# Patient Record
Sex: Male | Born: 1945 | Race: White | Hispanic: No | Marital: Single | Smoking: Never smoker
Health system: Southern US, Community
[De-identification: ages and names within clinical notes are randomized; demographics above are authoritative.]

---

## 2017-01-02 ENCOUNTER — Encounter: Payer: Self-pay | Admitting: Neurology

## 2017-01-02 ENCOUNTER — Ambulatory Visit (INDEPENDENT_AMBULATORY_CARE_PROVIDER_SITE_OTHER): Payer: Medicare Other | Admitting: Neurology

## 2017-01-02 VITALS — BP 121/76 | HR 86 | Ht 70.0 in | Wt 238.8 lb

## 2017-01-02 DIAGNOSIS — R479 Unspecified speech disturbances: Secondary | ICD-10-CM | POA: Insufficient documentation

## 2017-01-02 MED ORDER — ASPIRIN 325 MG PO TBEC: 325.0000 mg | DELAYED_RELEASE_TABLET | Freq: Every day | ORAL | 1 refills | Status: DC

## 2017-01-02 NOTE — Progress Notes (Signed)
[facility] [address] [place]. [place] [address] [phone]       OFFICE CONSULT NOTE  [name] Date of Birth:  09/14/1945 Medical Record Number:  [id number]   Referring MD:  Dr Lendon ColonelHawks  Reason for Referral:   Speech difficulty HPI: [name] who states that for the last 6 months is noticed some intermittent speech difficulties. He states that it began suddenly. He has to concentrate and speak and feels his speaking is not as fluent as it used to be. 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 He has to speak slowly and deliberately. He denies any slurred speech, word substitutions. His speech difficulty seems more pronounced when he is tired. He feels his diction is not the same. Patient denies any headache, loss of vision, facial weakness, gait or balance problems. Denies tingling numbness or pain. 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 He does complain of some intermittent right hand cramps as well as weakness particularly when he gets up in the morning. He states that he feels his been little clumsy of late but is had no falls or injuries. Is no prior history of strokes TIAs seizures significant head injury with loss of consciousness. He underwent MRI scan of the brain done at [facility] on 10/17/16 which was apparently normal. I do not have the actual films to look at. Denies significant memory loss or cognitive impairment. He lives alone and is independent in activities of daily living. He takes aspirin every day. He denies any , weight loss.  Dysphagia, choking. Fasciculations, muscle wasting ROS:   14 system review of systems is positive for  ringing in the ears, skin moles, joint pain, right hand weakness, slurred speech and all other systems negative  PMH: History reviewed. No pertinent  past medical history.  Social History:  Social History   Socioeconomic History  . Marital status: Single    Spouse name: Not on file  . Number of children: Not on file  . Years of education: Not on file  . Highest education level: Not on file  Social Needs  . Financial resource strain: Not on file  . Food insecurity - worry: Not on file  . Food insecurity - inability: Not on file  . Transportation needs - medical: Not on file  . Transportation needs - non-medical: Not on file  Occupational History  . Not on file  Tobacco Use  . Smoking status: Not on file  Substance and Sexual Activity  . Alcohol use: Not on file  . Drug use: Not on file  . Sexual activity: Not on file  Other Topics Concern  . Not on file  Social History Narrative  . Not on file    Medications:   Current Outpatient Medications on File Prior to Visit  Medication Sig Dispense Refill  . DUREZOL 0.05 % EMUL Apply 1 drop to eye daily.    . finasteride (PROSCAR) 5 MG tablet Take 1 tablet by mouth daily.  0  . Omega-3 Fatty Acids (FISH OIL) 1000 MG CAPS Take 1 capsule by mouth daily.    Marland Kitchen. omeprazole (PRILOSEC) 40 MG capsule Take 1 capsule by mouth daily.  11  . simvastatin (ZOCOR) 40 MG tablet Take 1 tablet by mouth daily.  1   No current facility-administered medications on  file prior to visit.     Allergies:  Not on File  Physical Exam General: well developed, well nourished, seated, in no evident distress Head: head normocephalic and atraumatic.   Neck: supple with no carotid or supraclavicular bruits Cardiovascular: regular rate and rhythm, no murmurs Musculoskeletal: no deformity Skin:  no rash/petichiae Vascular:  Normal pulses all extremities  Neurologic Exam Mental Status: Awake and fully alert. Oriented to place and time. Recent and remote memory intact. Attention span, concentration and fund of knowledge  Montreal Cognitive Assessment  25/30 01/02/2017  Visuospatial/ Executive (4/5) 4    Naming (2/3) 2  Attention: Read list of digits (2/2) 2  Attention: Read list of letters (1/1) 1  Attention: Serial 7 subtraction starting at 100 (0/3) 3  Language: Repeat phrase (2/2) 2  Language : Fluency (1/1) 1  Abstraction (2/2) 2  Delayed Recall (2/5) 2  Orientation (6/6) 6  Total 25   test carotid tests 25/30 with deficits in delayed recall and orientation. Clock drawing 4/4. Able to name 11 animals with full legs appropriate. Mood and affect appropriate.  Cranial Nerves: Fundoscopic exam reveals sharp disc margins. Pupils equal, briskly reactive to light. Extraocular movements full without nystagmus. Visual fields full to confrontation. Hearing intact. Facial sensation intact. Face, tongue, palate moves normally and symmetrically. No fasciculations on the tongue. Jaw jerk is not brisk  Motor: Normal bulk and tone. Normal strength in all tested extremity muscles. No fasciculations or muscle wasting noted Sensory.: intact to touch , pinprick , position and vibratory sensation.  Coordination: Rapid alternating movements normal in all extremities. Finger-to-nose and heel-to-shin performed accurately bilaterally. Gait and Station: Arises from chair without difficulty. Stance is normal. Gait demonstrates normal stride length and balance . Able to heel, toe and tandem walk without difficulty.  Reflexes: 1+ and symmetric. Toes downgoing.   NIHSS 0 Modified Rankin  2   ASSESSMENT: 71 year old Caucasian male with 6 month history of subtle speech difficulties of unclear etiology. Neurological exam is nonfocal except for mild cognitive impairment    PLAN: I had a long discussion with the patient regarding his mild speech disturbance which appears to be of unclear etiology. Is possible he could have had a small stroke since his symptoms began suddenly and his MRI scan was done a few months later hence and is not seen. Check repeat MRI scan the brain with and without contrast, MRA of the brain  and neck, lipid profile and hemoglobin A1c. Increase dose of aspirin from 81 to 325 mg daily. Maintain strict control of hypertension with blood pressure goal below 130/90, lipids with LDL cholesterol goal below 70 mg percent and diabetes with him about an A1c goal below 6.5%. Also eat health diet with lots of whole grains, cereals and low-salt  Check memory panel labs, EEG and participate in mentally challenging activities like solving crossword puzzles, playing sudoku and bridge. Greater than 50% time during this 45 minute consultation visit was spent on counseling and coordination of care about his speech difficulties and mild cognitive impairment and answering questions He'll return for follow-up in 2 months or call earlier if necessary Delia HeadyPramod Sinahi Knights, MD  [facility] [address] [place], [place] [id number]  Phone [phone] Fax [phone] Note: This document was prepared with digital dictation and possible smart phrase technology. Any transcriptional errors that result from this process are unintentional.

## 2017-01-02 NOTE — Patient Instructions (Addendum)
I had a long discussion with the patient regarding his mild speech disturbance which appears to be of unclear etiology. Is possible he could have had a small stroke since his symptoms began suddenly and his MRI scan was done a few months later hence and is not seen. Check repeat MRI scan the brain with and without contrast, MRA of the brain and neck, lipid profile and hemoglobin A1c. Increase dose of aspirin from 81 to 325 mg daily. Maintain strict control of hypertension with blood pressure goal below 130/90, lipids with LDL cholesterol goal below 70 mg percent and diabetes with him about an A1c goal below 6.5%. Also eat health diet with lots of whole grains, cereals and low-salt   Check memory panel labs, EEG and participate in mentally challenging activities like solving crossword puzzles, playing sudoku and bridge. He'll return for follow-up in 2 months or call earlier if necessary  Mild Neurocognitive Disorder Mild neurocognitive disorder (formerly known as mild cognitive impairment) is a mental disorder. It is a slight abnormal decrease in mental function. The areas of mental function affected may include memory, thought, communication, behavior, and completion of tasks. The decrease is noticeable and measurable but for the most part does not interfere with your daily activities. Mild neurocognitive disorder typically occurs in people older than 60 years but can occur earlier. It is not as serious as major neurocognitive disorder (formerly known as dementia) but may lead to a more serious neurocognitive disorder. However, in some cases the condition does not get worse. A few people with this disorder even improve. What are the causes? There are a number of different causes of mild neurocognitive disorder:  Brain disorders associated with abnormal protein deposits, such as Alzheimer's disease, Pick's disease, and Lewy body disease.  Brain disorders associated with abnormal movement, such as  Parkinson's disease and Huntington's disease.  Diseases affecting blood vessels in the brain and resulting in mini-strokes.  Certain infections, such as human immunodeficiency virus (HIV) infection.  Traumatic brain injury.  Other medical conditions such as brain tumors, underactive thyroid (hypothyroidism), and vitamin B12 deficiency.  Use of certain prescription medicine and "recreational" drugs.  What are the signs or symptoms? Symptoms of mild neurocognitive disorder include:  Difficulty remembering. You may forget details of recent events, names, or phone numbers. You may forget important social events and appointments or repeatedly forget where you put your car keys.  Difficulty thinking and solving problems. You may have trouble with complex tasks such as paying bills or driving in unfamiliar locations.  Difficulty communicating. You may have trouble finding the right word, naming an object, forming a sentence that makes sense, or understanding what you read or hear.  Changes in your behavior or personality. You may lose interest in the things that you used to enjoy or withdraw from social situations. You may get angry more easily than usual. You may act before thinking. You may do things in public that you would not usually do. You may hear or see things that are not real (hallucinations). You may believe falsely that others are trying to hurt you (paranoia).  How is this diagnosed? Mild neurocognitive disorder is diagnosed through an assessment by your health care provider. Your health care provider will ask you and your family, friends, or coworkers questions about your symptoms. He or she will ask how often the symptoms occur, how long they have been occurring, whether they are getting worse, and the effect they are having on your life. Your health  care provider may refer you to a neurologist or mental health specialist for a detailed evaluation of your mental functions  (neuropsychological testing). To identify the cause of your mild neurocognitive disorder, your health care provider may:  Obtain a detailed medical history.  Ask about alcohol and drug use, including prescription medicine.  Perform a physical exam.  Order blood tests and brain imaging exams.  How is this treated? Mild neurocognitive disorder caused by infections, use of certain medicines or "recreational" drugs, and certain medical conditions may improve with treatment of the condition that is causing the disorder. Mild neurocognitive disorder resulting from other causes generally does not improve and may worsen. In these cases, the goal of treatment is to slow progression of the disorder and help you cope with the loss of mental function. Treatments in these cases include:  Medicine. Medicine helps mainly with memory loss and behavioral symptoms.  Talk therapy. Talk therapy provides education, emotional support, memory aids, and other ways of making up for decreases in mental function.  Lifestyle changes. These include regular exercise, a healthy diet (including essential omega-3 fatty acids), intellectual stimulation, and increased social interaction.  This information is not intended to replace advice given to you by your health care provider. Make sure you discuss any questions you have with your health care provider. Document Released: 09/02/2012 Document Revised: 06/08/2015 Document Reviewed: 05/25/2012 Elsevier Interactive Patient Education  2017 [employer]

## 2017-01-03 LAB — LIPID PANEL
CHOLESTEROL TOTAL: 190 mg/dL (ref 100–199)
Chol/HDL Ratio: 3.7 ratio (ref 0.0–5.0)
HDL: 52 mg/dL (ref >39)
LDL CALC: 115 mg/dL — AB (ref 0–99)
TRIGLYCERIDES: 117 mg/dL (ref 0–149)
VLDL Cholesterol Cal: 23 mg/dL (ref 5–40)

## 2017-01-03 LAB — HEMOGLOBIN A1C
ESTIMATED AVERAGE GLUCOSE: 123 mg/dL
HEMOGLOBIN A1C: 5.9 % — AB (ref 4.8–5.6)

## 2017-01-03 LAB — DEMENTIA PANEL
Homocysteine: 11.2 umol/L (ref 0.0–15.0)
RPR: NONREACTIVE
TSH: 0.348 u[IU]/mL — AB (ref 0.450–4.500)
Vitamin B-12: 1183 pg/mL (ref 232–1245)

## 2017-01-10 ENCOUNTER — Telehealth: Payer: Self-pay | Admitting: *Deleted

## 2017-01-10 NOTE — Telephone Encounter (Addendum)
I called and spoke with the patient. I informed him that his blood test for diabetes was okay but cholesterol was a little high and thyroid was overactive. The rest of the lab work was unremarkable. He needs to see his primary care physician to adjust his medications for cholesterol and thyroid. He verbalized understanding and stated he would call Dr. Lendon ColonelHawks, his PCP. He also stated that he is not taking any medication for thyroid. I routed the results to Dr. Lendon ColonelHawks for review. He verbalized appreciation for the call.      ----- Message from Micki RileyPramod S Sethi, MD sent at 01/03/2017 12:00 PM EST ----- Kindly call the patient to inform him that his blood test for diabetes was okay but cholesterol was little high and thyroid was overactive. Rest of the lab work was unremarkable. He needs to see his primary care physician to adjust his medications for cholesterol and thyroid

## 2017-01-22 ENCOUNTER — Other Ambulatory Visit: Payer: Self-pay

## 2017-01-22 ENCOUNTER — Ambulatory Visit (INDEPENDENT_AMBULATORY_CARE_PROVIDER_SITE_OTHER): Payer: Medicare Other

## 2017-01-22 DIAGNOSIS — R2990 Unspecified symptoms and signs involving the nervous system: Secondary | ICD-10-CM

## 2017-01-22 DIAGNOSIS — R479 Unspecified speech disturbances: Secondary | ICD-10-CM

## 2017-01-24 ENCOUNTER — Ambulatory Visit
Admission: RE | Admit: 2017-01-24 | Discharge: 2017-01-24 | Disposition: A | Discharge status: Home / Self Care | Payer: Medicare Other | Source: Ambulatory Visit | Attending: Neurology | Admitting: Neurology

## 2017-01-24 DIAGNOSIS — R479 Unspecified speech disturbances: Secondary | ICD-10-CM

## 2017-01-24 MED ORDER — GADOBENATE DIMEGLUMINE 529 MG/ML IV SOLN
20.0000 mL | Freq: Once | INTRAVENOUS | Status: AC | PRN
  Administered 2017-01-24: 20 mL via INTRAVENOUS

## 2017-01-28 ENCOUNTER — Telehealth: Payer: Self-pay

## 2017-01-28 NOTE — Telephone Encounter (Signed)
FYI pt called back, message was relayed to him.  Pt has not questions.

## 2017-01-28 NOTE — Telephone Encounter (Signed)
-----   Message from Micki RileyPramod S Sethi, MD sent at 01/27/2017  5:58 PM EST ----- Kindly let the patient on that EEG study was normal

## 2017-01-28 NOTE — Telephone Encounter (Signed)
IF patient calls back tell them Dr.Sethi read the EEG and it was a normal study.  Left vm for patient to call back about eeg results.

## 2017-01-29 ENCOUNTER — Telehealth: Payer: Self-pay | Admitting: Neurology

## 2017-01-29 NOTE — Telephone Encounter (Signed)
Pt has called for results of MRI, RN Katrina confirmed she will contact pt once results are available

## 2017-01-29 NOTE — Telephone Encounter (Signed)
Patient would like MRi, brain, MR head, MRI neck results. Please advise thanks.

## 2017-01-31 NOTE — Telephone Encounter (Signed)
I called the patient listed telephone number and left message communicating that MRI scan the brain and MRA of the brain and neck were all unremarkable. Nothing to worry about. He was advised to call me. Incase he had questions.

## 2017-02-17 NOTE — Telephone Encounter (Signed)
Message sent to Dr. Pearlean BrownieSethi.   Dr.Sethi see patients note below.Please advise thanks.

## 2017-02-17 NOTE — Telephone Encounter (Signed)
Advised patient of unremarkable MRI and MRA results and normal EEG results per previous messages. He wants to know should he go back to his PCP for his speech slurring problems because this is why he had these tests? Please call and discuss.

## 2017-02-17 NOTE — Telephone Encounter (Signed)
I spoke to the patient and clarified the reason I want him to see his primary physician is due to his low TSH which necessitates treatment for hyperthyroidism. He was advised to keep his scheduled follow-up appointment with me in 3 weeks. He voiced understanding.

## 2017-03-11 ENCOUNTER — Ambulatory Visit (INDEPENDENT_AMBULATORY_CARE_PROVIDER_SITE_OTHER): Payer: Medicare Other | Admitting: Neurology

## 2017-03-11 ENCOUNTER — Other Ambulatory Visit: Payer: Self-pay

## 2017-03-11 ENCOUNTER — Encounter: Payer: Self-pay | Admitting: Neurology

## 2017-03-11 VITALS — BP 147/97 | HR 73 | Wt 240.8 lb

## 2017-03-11 DIAGNOSIS — R479 Unspecified speech disturbances: Secondary | ICD-10-CM | POA: Diagnosis not present

## 2017-03-11 MED ORDER — ASPIRIN EC 325 MG PO TBEC: 325.0000 mg | DELAYED_RELEASE_TABLET | Freq: Every day | ORAL | 1 refills | Status: AC

## 2017-03-11 NOTE — Patient Instructions (Signed)
I had a long discussion with the patient regarding his symptoms of intermittent speech and diction difficulties. We discussed the results of imaging studies and recent lab work and answered questions. Patient notes diurnal fluctuation in symptoms in would like myasthenia gravis and ALS to be ruled out. Check EMG with rapid repetitive stimulation for myasthenia as well as regular EMG study for ALS. Continue aspirin for stroke prevention. I recommend he increase the dose of Zocor dose 60 mg daily but he feels he will try diet control first. He'll follow-up with his primary care physician for repeat lipid profile. Return for follow-up with me in 3 months or call earlier if necessary

## 2017-03-11 NOTE — Progress Notes (Signed)
[facility] [address] [place]. [place] [address] [phone]       OFFICE FOLLOW UP VISItT NOTE  [name] Date of Birth:  1945/05/04 Medical Record Number:  [id number]   Referring MD:  Dr Marcus Stewart  Reason for Referral:   Speech difficulty HPI: Initial Consult 01/02/2017: Marcus Stewart is a pleasant 72 year old Caucasian male who states that for the last 6 months is noticed some intermittent speech difficulties. He states that it began suddenly. He has to concentrate and speak and feels his speaking is not as fluent as it used to be. 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 He has to speak slowly and deliberately. He denies any slurred speech, word substitutions. His speech difficulty seems more pronounced when he is tired. He feels his diction is not the same. Patient denies any headache, loss of vision, facial weakness, gait or balance problems. Denies tingling numbness or pain. 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 He does complain of some intermittent right hand cramps as well as weakness particularly when he gets up in the morning. He states that he feels his been little clumsy of late but is had no falls or injuries. Is no prior history of strokes TIAs seizures significant head injury with loss of consciousness. He underwent MRI scan of the brain done at [facility] on 10/17/16 which was apparently normal. I do not have the actual films to look at. Denies significant memory loss or cognitive impairment. He lives alone and is independent in activities of daily living. He takes aspirin every day. He denies any , weight loss.  Dysphagia, choking. Fasciculations, muscle wasting Update 03/11/2017 : He returns for follow-up after last visit 2 months ago. He had lab work done on 01/02/17 which showed normal vitamin B12, RPR per TSH was  suppressed. This is primary care physician Dr. Cindra Stewart repeated TSH and it was apparently normal. MRI scan of the brain on 01/24/17 personally reviewed the films myself shows mild changes of chronic microvascular ischemia. MRA of the brain and neck both do not show significant intracranial or intracranial stenosis. EEG done on 01/27/17 was normal. Patient states that he still has intermittent speech to 50s. He he feels that this may get worse wasn't other day. He also reports occasional swallowing difficulty with food particularly in the evening to ascend of his meal. He denies any drooping of eyelids or diplopia or excess fatigue however he has done some reading about masking and Feliz Beam and wants this to be ruled out. His appetite is good and his weight is stable. Denies any muscle wasting thoracic lesions but does complain of some weakness in his right grip and difficulty bending his hands with some twitching particularly in the morning but this gets better after a few hours. 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 Patient's LDL cholesterol elevated at 117 mg%and he  has  been taking simvastatin 40 mg  And tolerating it well without muscle aches and pains. ROS:   14 system review of systems is positive for hearing loss, ringing in the ears, restless leg, snoring, joint and back pain, walking difficulty, skin moles, speech difficulty, weakness and all other systems negative  PMH: History reviewed. No pertinent past medical history.  Social History:  Social History   Socioeconomic History  . Marital status: Single    Spouse  name: Not on file  . Number of children: Not on file  . Years of education: Not on file  . Highest education level: Not on file  Social Needs  . Financial resource strain: Not on file  . Food insecurity - worry: Not on file  . Food insecurity - inability: Not on file  . Transportation needs - medical: Not on file  . Transportation needs - non-medical: Not on file    Occupational History  . Not on file  Tobacco Use  . Smoking status: Never Smoker  . Smokeless tobacco: Never Used  Substance and Sexual Activity  . Alcohol use: No    Frequency: Never  . Drug use: No  . Sexual activity: Not on file  Other Topics Concern  . Not on file  Social History Narrative  . Not on file    Medications:   Current Outpatient Medications on File Prior to Visit  Medication Sig Dispense Refill  . finasteride (PROSCAR) 5 MG tablet Take 1 tablet by mouth daily.  0  . Multiple Vitamins-Minerals (MULTIVITAMIN ADULT PO) Take by mouth.    . Omega-3 Fatty Acids (FISH OIL) 1000 MG CAPS Take 1 capsule by mouth daily.    Marland Kitchen omeprazole (PRILOSEC) 40 MG capsule Take 1 capsule by mouth daily.  11  . simvastatin (ZOCOR) 40 MG tablet Take 1 tablet by mouth daily.  1   No current facility-administered medications on file prior to visit.     Allergies:  Not on File  Physical Exam General: well developed, well nourished elderly obese Caucasian male, seated, in no evident distress Head: head normocephalic and atraumatic.   Neck: supple with no carotid or supraclavicular bruits Cardiovascular: regular rate and rhythm, no murmurs Musculoskeletal: no deformity Skin:  no rash/petichiae Vascular:  Normal pulses all extremities  Neurologic Exam Mental Status: Awake and fully alert. Oriented to place and time. Recent and remote memory intact. Attention span, concentration and fund of knowledge  Montreal Cognitive Assessment  25/30 01/02/2017  Visuospatial/ Executive (4/5) 4  Naming (2/3) 2  Attention: Read list of digits (2/2) 2  Attention: Read list of letters (1/1) 1  Attention: Serial 7 subtraction starting at 100 (0/3) 3  Language: Repeat phrase (2/2) 2  Language : Fluency (1/1) 1  Abstraction (2/2) 2  Delayed Recall (2/5) 2  Orientation (6/6) 6  Total 25   MMSE not done today Mood and affect appropriate.  Cranial Nerves: Fundoscopic exam reveals sharp disc margins.  Pupils equal, briskly reactive to light. Extraocular movements full without nystagmus. Visual fields full to confrontation. Hearing intact. Facial sensation intact. Face, tongue, palate moves normally and symmetrically. No fasciculations on the tongue. Jaw jerk is not brisk  Motor: Normal bulk and tone. Normal strength in all tested extremity muscles. No fasciculations or muscle wasting noted Sensory.: intact to touch , pinprick , position and vibratory sensation.  Coordination: Rapid alternating movements normal in all extremities. Finger-to-nose and heel-to-shin performed accurately bilaterally. Gait and Station: Arises from chair without difficulty. Stance is normal. Gait demonstrates normal stride length and balance . Able to heel, toe and tandem walk without difficulty.  Reflexes: 1+ and symmetric. Toes downgoing.       ASSESSMENT: 72 year old Caucasian male with 6 month history of subtle speech difficulties of unclear etiology. Neurological exam is nonfocal except for mild cognitive impairment    PLAN: I had a long discussion with the patient regarding his symptoms of intermittent speech and diction difficulties. We discussed the results  of imaging studies and recent lab work and answered questions. Patient notes diurnal fluctuation in symptoms in would like myasthenia gravis and ALS to be ruled out. Check EMG with rapid repetitive stimulation for myasthenia as well as regular EMG study for ALS. Continue aspirin for stroke prevention. I recommend he increase the dose of Zocor dose 60 mg daily but he feels he will try diet control first. He'll follow-up with his primary care physician for repeat lipid profile. Return for follow-up with me in 3 months or call earlier if necessary. Greater than 50% time during this 25 minute  visit was spent on counseling and coordination of care about his speech difficulties and mild cognitive impairment and answering questions He'll return for follow-up in 3  months or call earlier if necessary Delia HeadyPramod Basha Krygier, MD  [facility] [address] [place], [place] [id number]  Phone [phone] Fax [phone] Note: This document was prepared with digital dictation and possible smart phrase technology. Any transcriptional errors that result from this process are unintentional.

## 2017-03-19 ENCOUNTER — Ambulatory Visit (INDEPENDENT_AMBULATORY_CARE_PROVIDER_SITE_OTHER): Payer: Medicare Other | Admitting: Neurology

## 2017-03-19 ENCOUNTER — Encounter: Payer: Self-pay | Admitting: Neurology

## 2017-03-19 DIAGNOSIS — R479 Unspecified speech disturbances: Secondary | ICD-10-CM

## 2017-03-19 DIAGNOSIS — R531 Weakness: Secondary | ICD-10-CM

## 2017-03-19 NOTE — Progress Notes (Signed)
Please refer to EMG and nerve conduction study procedure note. 

## 2017-03-19 NOTE — Procedures (Signed)
HISTORY:  Marcus Stewart is a 72 year old gentleman who reports onset of some slurred speech since October 2018.  This has worsened over time.  The patient has had some weakness of the right arm in particular, he has some leg weakness, he has fallen within the last week.  The patient is being evaluated for this progressive weakness.  NERVE CONDUCTION STUDIES:  Nerve conduction studies were performed on both upper extremities. The distal motor latencies and motor amplitudes for the median and ulnar nerves were within normal limits. The F wave latency for the ulnar nerves and nerve conduction velocities for the median and ulnar nerves bilaterally nerves were also normal. The sensory latencies for the median and ulnar nerves were normal.  Nerve conduction studies were performed on the left lower extremity. The distal motor latencies and motor amplitudes for the peroneal and posterior tibial nerves were within normal limits. The nerve conduction velocities for these nerves were also normal. The sensory latencies for the sural nerves were within normal limits.  The F-wave latencies for the left posterior tibial nerve was normal.  Repetitive nerve stimulation was performed on the right upper extremity, the study was done at 3 Hz stimulation with pick up on the right abductor digiti minimi muscle.  This was also done to the right upper trapezius muscle.  After muscle fatigue, stimulations were done at 30 seconds, 1 minute, 2 minutes, 3 minutes, 4 minutes, and 5 minutes following muscle fatigue.  There was no evidence of a significant decremental response seen.   EMG STUDIES:  EMG study was performed on the right upper extremity:  The first dorsal interosseous muscle reveals 2 to 4 K units with slightly reduced recruitment. No fibrillations or positive waves were noted. The abductor pollicis brevis muscle reveals 2 to 5 K units with reduced recruitment. No fibrillations or positive waves were  noted. The extensor indicis proprius muscle reveals 1 to 3 K units with full recruitment. No fibrillations or positive waves were noted. The pronator teres muscle reveals 2 to 3 K units with full recruitment. No fibrillations or positive waves were noted. The biceps muscle reveals 1 to 2 K units with full recruitment. No fibrillations or positive waves were noted. The triceps muscle reveals 2 to 4 K units with full recruitment. No fibrillations or positive waves were noted. The anterior deltoid muscle reveals 2 to 3 K units with full recruitment. No fibrillations or positive waves were noted. The cervical paraspinal muscles were tested at 2 levels. No abnormalities of insertional activity were seen at either level tested. There was good relaxation.  EMG study was performed on the left lower extremity:  The tibialis anterior muscle reveals 2 to 4K motor units with full recruitment. No fibrillations or positive waves were seen. The peroneus tertius muscle reveals 2 to 4K motor units with full recruitment. No fibrillations or positive waves were seen. The medial gastrocnemius muscle reveals 1 to 3K motor units with full recruitment. No fibrillations or positive waves were seen. The vastus lateralis muscle reveals 2 to 4K motor units with full recruitment. No fibrillations or positive waves were seen. The iliopsoas muscle reveals 2 to 4K motor units with full recruitment. No fibrillations or positive waves were seen. The biceps femoris muscle (long head) reveals 2 to 4K motor units with full recruitment. No fibrillations or positive waves were seen. The lumbosacral paraspinal muscles were tested at 3 levels, and revealed no abnormalities of insertional activity at all 3 levels tested. There  was good relaxation.  The genioglossus muscle was tested.  The patient had full recruitment, 0.5 to 1 K motor units.  No fibrillations or positive waves or fasciculations were seen.  IMPRESSION:  Nerve  conduction studies done on both arms in the left lower extremity were unremarkable, no evidence of a peripheral neuropathy was seen.  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  EMG evaluation of the left lower extremity was unremarkable.  No evidence of a myopathic or acute neuropathic denervation was seen.  Repetitive nerve stimulation done on the right upper extremity both proximally and distally was unremarkable, no clear evidence of a neuromuscular transmission disorder was seen.  Marlan Palau MD 03/19/2017 5:15 PM  [facility] [address] [place], [place] [address]  Phone [phone] Fax [phone]

## 2017-03-19 NOTE — Progress Notes (Addendum)
Kindly inform the patient that EMG nerve conduction study did not show any evidence of myasthenia or neuropathy      MNC    Nerve / Sites Muscle Latency Ref. Amplitude Ref. Rel Amp Segments Distance Velocity Ref. Area    ms ms mV mV %  cm m/s m/s mVms  L Median - APB     Wrist APB 3.5 ?4.4 5.0 ?4.0 100 Wrist - APB 7   19.5     Upper arm APB 7.7  4.9  97.8 Upper arm - Wrist 22 53 ?49 17.8  R Median - APB     Wrist APB 3.6 ?4.4 5.5 ?4.0 100 Wrist - APB 7   20.7     Upper arm APB 7.7  5.5  99.3 Upper arm - Wrist 22 54 ?49 21.4  L Ulnar - ADM     Wrist ADM 2.9 ?3.3 7.6 ?6.0 100 Wrist - ADM 7   23.4     B.Elbow ADM 6.7  6.4  84.1 B.Elbow - Wrist 20 53 ?49 21.8     A.Elbow ADM 8.9  6.2  95.9 A.Elbow - B.Elbow 12 54 ?49 21.1         A.Elbow - Wrist      R Ulnar - ADM     Wrist ADM 3.1 ?3.3 6.7 ?6.0 100 Wrist - ADM 7   16.6     B.Elbow ADM 7.0  5.7  84.6 B.Elbow - Wrist 20 52 ?49 12.8     A.Elbow ADM 9.4  5.6  97.2 A.Elbow - B.Elbow 12 49 ?49 13.5         A.Elbow - Wrist      L Peroneal - EDB     Ankle EDB 4.3 ?6.5 6.6 ?2.0 100 Ankle - EDB 9   21.2     Fib head EDB 11.0  5.8  88.6 Fib head - Ankle 33 49 ?44 20.0     Pop fossa EDB 13.4  5.5  93.8 Pop fossa - Fib head 12 50 ?44 18.4         Pop fossa - Ankle      L Tibial - AH     Ankle AH 4.5 ?5.8 4.1 ?4.0 100 Ankle - AH 9   13.5     Pop fossa AH 13.0  3.7  91.2 Pop fossa - Ankle 36 42 ?41 13.8                 SNC    Nerve / Sites Rec. Site Peak Lat Ref.  Amp Ref. Segments Distance    ms ms V V  cm  L Sural - Ankle (Calf)     Calf Ankle 4.2 ?4.4 6 ?6 Calf - Ankle 14  L Superficial peroneal - Ankle     Lat leg Ankle 4.3 ?4.4 6 ?6 Lat leg - Ankle 14  L Median - Orthodromic (Dig II, Mid palm)     Dig II Wrist 3.0 ?3.4 21 ?10 Dig II - Wrist 13  R Median - Orthodromic (Dig II, Mid palm)     Dig II Wrist 3.1 ?3.4 16 ?10 Dig II - Wrist 13  L Ulnar - Orthodromic, (Dig V, Mid palm)     Dig V Wrist 2.6 ?3.1 5 ?5 Dig V - Wrist 11  R  Ulnar - Orthodromic, (Dig V, Mid palm)     Dig V Wrist 2.7 ?3.1 7 ?5 Dig V - Wrist 11  F  Wave    Nerve F Lat Ref.   ms ms  L Ulnar - ADM 28.9 ?32.0  L Tibial - AH 51.8 ?56.0  R Ulnar - ADM 29.1 ?32.0                 Rep Stim    Anatomy / Train Rate Ampl. Ampl 4-1 Fac Ampl Area Area 4-1 Fac Area   Hz mV % % mVms % %  R Abductor digiti minimi (manus) - (Ulnar)  Baseline @1Hz  1 6.8 1 100 14.5 -4.4 100  Baseline @3Hz  3 6.8 0.7 100 14.4 -6.5 99.3  Post Exercise @0 :00 3 7.1 2.2 104 15.7 -5.3 109  @ 0:30 3 7.4 -0.8 109 17.1 -7.1 118  @ 1:00 3 7.3 -0.8 108 16.7 -6.8 115  @ 2:00 3 7.4 0.6 109 16.3 -4.5 113  @ 3:00 3 7.5 0.4 110 16.2 -5.4 112  @ 4:00 3 7.3 0.7 107 15.5 -6.3 108  @ 5:00 3 7.3 -0.2 107 15.2 -7.2 105  R Trapezius (upper) - (Accessory spinal)  Baseline @1Hz  1 4.1 3.7 100 30.9 -3.9 100  Baseline @3Hz  3 4.4 -3.9 107 29.5 -10.5 95.3  Post Exercise @0 :00 3 4.1 -0.9 100 29.9 -12.5 96.6  @ 0:30 3 4.1 -0.1 101 29.4 -8.4 95.2  @ 1:00 3 4.3 2.2 104 29.9 -7.9 96.6  @ 2:00 3 4.3 -1 104 31.2 -10.1 101  @ 3:00 3 4.2 2.1 102 31.6 -8.4 102  @ 4:00 3 4.2 1.1 102 31.3 -10.3 101  @ 5:00 3 4.4 -1.3 106 30.8 -9 99.6             EMG full

## 2017-03-20 ENCOUNTER — Telehealth: Payer: Self-pay

## 2017-03-20 NOTE — Progress Notes (Signed)
Rn call patient that the EMG nerve conduction study did not show any evidence of myasthenia or neuropathy. Patient verbalized understanding.   

## 2017-03-20 NOTE — Telephone Encounter (Signed)
Rn call patient that the EMG nerve conduction study did not show any evidence of myasthenia or neuropathy. Patient verbalized understanding.

## 2017-03-20 NOTE — Telephone Encounter (Signed)
Author: Micki RileySethi, Pramod S, MD Service: Neurology Author Type: Physician  Filed: 03/19/2017 5:34 PM Encounter Date: 03/19/2017 Status: Signed  Editor: Micki RileySethi, Pramod S, MD (Physician)       Kindly inform the patient that EMG nerve conduction study did not show any evidence of myasthenia or neuropathy

## 2017-06-16 ENCOUNTER — Ambulatory Visit (INDEPENDENT_AMBULATORY_CARE_PROVIDER_SITE_OTHER): Payer: Medicare Other | Admitting: Neurology

## 2017-06-16 ENCOUNTER — Encounter: Payer: Self-pay | Admitting: Neurology

## 2017-06-16 VITALS — BP 118/79 | HR 80 | Ht 70.0 in | Wt 229.2 lb

## 2017-06-16 DIAGNOSIS — R479 Unspecified speech disturbances: Secondary | ICD-10-CM | POA: Diagnosis not present

## 2017-06-16 NOTE — Patient Instructions (Signed)
I had a long discussion with the patient regarding his intermittent symptoms of speech disturbance which still remain unexplained but I discussed EMG nerve conduction studies results with him and answered questions.  No further neurological work-up or treatment is indicated at the present time.  Continue treatment for his yeast infection with nystatin and follow-up with ENT physician.  No scheduled follow-up appointment with me is necessary.

## 2017-06-16 NOTE — Progress Notes (Signed)
[facility] [address] [place]. [place] [id number] [phone]       OFFICE FOLLOW UP VISItT NOTE  [name] Date of Birth:  04/28/1945 Medical Record Number:  [id number]   Referring MD:  Dr Lendon ColonelHawks  Reason for Referral:   Speech difficulty HPI: Initial Consult 01/02/2017: [name] is a pleasant 72 year old Caucasian male who states that for the last 6 months is noticed some intermittent speech difficulties. He states that it began suddenly. He has to concentrate and speak and feels his speaking is not as fluent as it used to be. 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 He has to speak slowly and deliberately. He denies any slurred speech, word substitutions. His speech difficulty seems more pronounced when he is tired. He feels his diction is not the same. Patient denies any headache, loss of vision, facial weakness, gait or balance problems. Denies tingling numbness or pain. 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 He does complain of some intermittent right hand cramps as well as weakness particularly when he gets up in the morning. He states that he feels his been little clumsy of late but is had no falls or injuries. Is no prior history of strokes TIAs seizures significant head injury with loss of consciousness. He underwent MRI scan of the brain done at [facility] on 10/17/16 which was apparently normal. I do not have the actual films to look at. Denies significant memory loss or cognitive impairment. He lives alone and is independent in activities of daily living. He takes aspirin every day. He denies any , weight loss.  Dysphagia, choking. Fasciculations, muscle wasting Update 03/11/2017 : He returns for follow-up after last visit 2 months ago. He had lab work done on 01/02/17 which showed normal vitamin B12, RPR per TSH was  suppressed. This is primary care physician Dr. Cindra EvesHawkwsr repeated TSH and it was apparently normal. MRI scan of the brain on 01/24/17 personally reviewed the films myself shows mild changes of chronic microvascular ischemia. MRA of the brain and neck both do not show significant intracranial or intracranial stenosis. EEG done on 01/27/17 was normal. Patient states that he still has intermittent speech to 50s. He he feels that this may get worse wasn't other day. He also reports occasional swallowing difficulty with food particularly in the evening to ascend of his meal. He denies any drooping of eyelids or diplopia or excess fatigue however he has done some reading about masking and Marcus Stewart and wants this to be ruled out. His appetite is good and his weight is stable. Denies any muscle wasting thoracic lesions but does complain of some weakness in his right grip and difficulty bending his hands with some twitching particularly in the morning but this gets better after a few hours. 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 Patient's LDL cholesterol elevated at 117 mg%and he  has  been taking simvastatin 40 mg  And tolerating it well without muscle aches and pains. Update 06/16/2017 : He returns for follow-up after last visit 3 months ago.  He states he continues to have some intermittent speech difficulties.  Since last visit he saw his primary care physician Dr. Lendon ColonelHawks who referred him to the thyroid specialist Dr. Katrinka Stewart.  Thyroid ultrasound was done which showed no significant change in his goiter with no need for biopsy.  He was referred to ENT Dr. Verne Spurr who did laryngeal endoscopy which showed presence of yeast.  He is currently being treated with nystatin swish and swallow.  He feels that it may be helping somewhat.  He did undergo EMG nerve conduction studies on 03/19/2017 by Dr. Anne Hahn which showed no evidence of neuropathy, radiculopathy, ALS.  Rapid repetitive stimulation studies were negative for  myasthenia.  He has no new neurological complaints. ROS:   14 system review of systems is positive for hearing loss, weakness, joint pain, skin moles, speech difficulty, weakness and all other systems negative  PMH: History reviewed. No pertinent past medical history.  Social History:  Social History   Socioeconomic History  . Marital status: Single    Spouse name: Not on file  . Number of children: Not on file  . Years of education: Not on file  . Highest education level: Not on file  Occupational History  . Not on file  Social Needs  . Financial resource strain: Not on file  . Food insecurity:    Worry: Not on file    Inability: Not on file  . Transportation needs:    Medical: Not on file    Non-medical: Not on file  Tobacco Use  . Smoking status: Never Smoker  . Smokeless tobacco: Never Used  Substance and Sexual Activity  . Alcohol use: No    Frequency: Never  . Drug use: No  . Sexual activity: Not on file  Lifestyle  . Physical activity:    Days per week: Not on file    Minutes per session: Not on file  . Stress: Not on file  Relationships  . Social connections:    Talks on phone: Not on file    Gets together: Not on file    Attends religious service: Not on file    Active member of club or organization: Not on file    Attends meetings of clubs or organizations: Not on file    Relationship status: Not on file  . Intimate partner violence:    Fear of current or ex partner: Not on file    Emotionally abused: Not on file    Physically abused: Not on file    Forced sexual activity: Not on file  Other Topics Concern  . Not on file  Social History Narrative  . Not on file    Medications:   Current Outpatient Medications on File Prior to Visit  Medication Sig Dispense Refill  . aspirin EC 325 MG tablet Take 1 tablet (325 mg total) by mouth daily. 90 tablet 1  . finasteride (PROSCAR) 5 MG tablet Take 1 tablet by mouth daily.  0  . Multiple Vitamins-Minerals  (MULTIVITAMIN ADULT PO) Take by mouth.    . nystatin (MYCOSTATIN) 100000 UNIT/ML suspension TK 5 ML PO TID FOR 10 DAYS  0  . Omega-3 Fatty Acids (FISH OIL) 1000 MG CAPS Take 1 capsule by mouth daily.    Marland Kitchen omeprazole (PRILOSEC) 40 MG capsule Take 1 capsule by mouth daily as needed.   11  . simvastatin (ZOCOR) 40 MG tablet Take 2 tablets by mouth daily.   1   No current facility-administered medications on file prior to visit.     Allergies:  Not on File  Physical Exam General: well developed, well nourished elderly obese Caucasian male, seated, in no evident distress Head: head normocephalic and atraumatic.   Neck: supple with no carotid or supraclavicular bruits Cardiovascular: regular rate and rhythm, no  murmurs Musculoskeletal: no deformity Skin:  no rash/petichiae Vascular:  Normal pulses all extremities  Neurologic Exam Mental Status: Awake and fully alert. Oriented to place and time. Recent and remote memory intact. Attention span, concentration and fund of knowledge                                    In the ears,   MMSE not done today Mood and affect appropriate.  Cranial Nerves: Fundoscopic exam reveals sharp disc margins. Pupils equal, briskly reactive to light. Extraocular movements full without nystagmus. Visual fields full to confrontation. Hearing intact. Facial sensation intact. Face, tongue, palate moves normally and symmetrically. No fasciculations on the tongue. Jaw jerk is not brisk  Motor: Normal bulk and tone. Normal strength in all tested extremity muscles. No fasciculations or muscle wasting noted Sensory.: intact to touch , pinprick , position and vibratory sensation.  Coordination: Rapid alternating movements normal in all extremities. Finger-to-nose and heel-to-shin performed accurately bilaterally. Gait and Station: Arises from chair without difficulty. Stance is normal. Gait demonstrates normal stride length and balance . Able to heel, toe and tandem  walk without difficulty.  Reflexes: 1+ and symmetric. Toes downgoing.       ASSESSMENT: 72 year old Caucasian male with 6 month history of subtle speech difficulties of unclear etiology. Neurological exam is nonfocal except for mild cognitive impairment which appears stable.  EMG nerve conduction studies were unremarkable    PLAN: I had a long discussion with the patient regarding his intermittent symptoms of speech disturbance which still remain unexplained but I discussed EMG nerve conduction studies results with him and answered questions.  No further neurological work-up or treatment is indicated at the present time.  Continue treatment for his yeast infection with nystatin and follow-up with ENT physician.  No scheduled follow-up appointment with me is necessary. Continue aspirin for stroke prevention.Continue Zocor dose 80 mg daily   He'll follow-up with his primary care physician for repeat lipid profile.   Greater than 50% time during this 25 minute  visit was spent on counseling and coordination of care about his speech difficulties and mild cognitive impairment and answering questions He'll return for follow-up in 3 months or call earlier if necessary Delia Heady, MD  [facility] [address] [place], [place] [address]  Phone [phone] Fax [phone] Note: This document was prepared with digital dictation and possible smart phrase technology. Any transcriptional errors that result from this process are unintentional.

## 2019-02-28 IMAGING — MR MR HEAD WO/W CM
16 of 19 series · 34 of 48 positions shown · IV contrast (multihance)
Comparison: None.

CLINICAL DATA: Speech disturbance.  Stroke.



[Series 3: tof_3d_multi-slab · axial · 0.7mm · 0.35mm/px · z∈[-113,-5]mm · 6 of 156 slices shown]
[im 1/156]
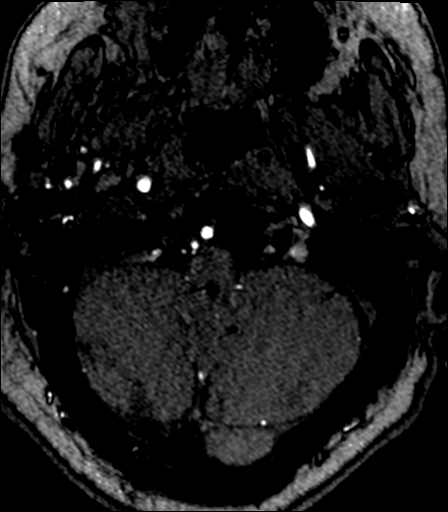
[im 32/156]
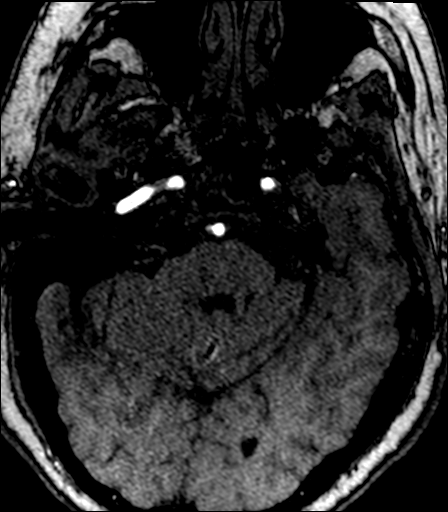
[im 63/156]
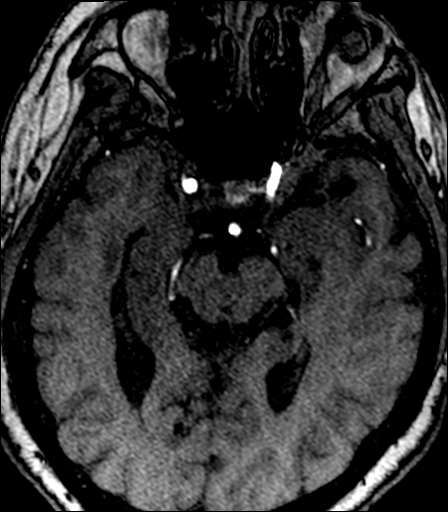
[im 94/156]
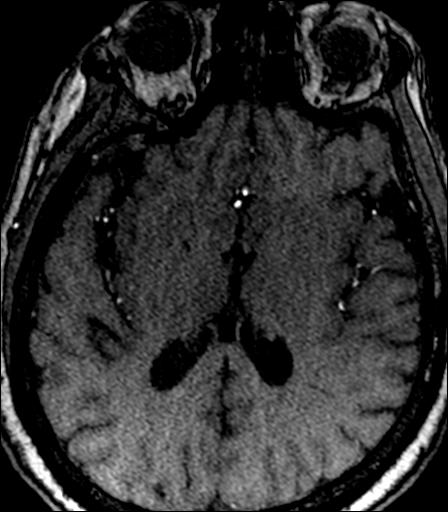
[im 125/156]
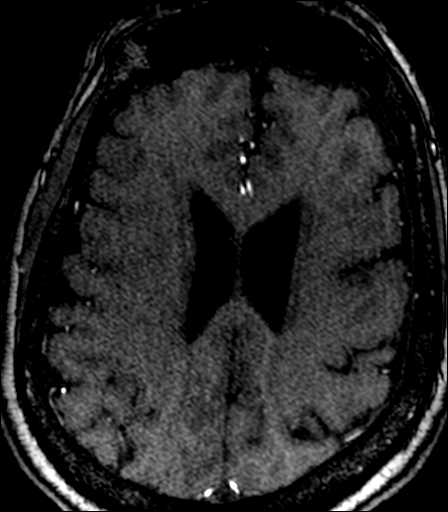
[im 156/156]
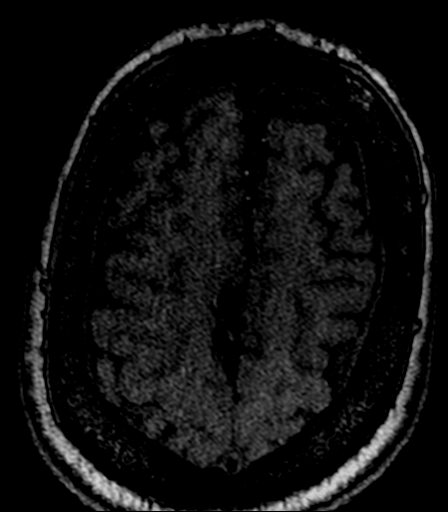

[Series 6: tof_3d_multi-slab_mip_tra · axial · 109.2mm · 0.35mm/px · 1 of 1 slices shown]
[im 1/1]
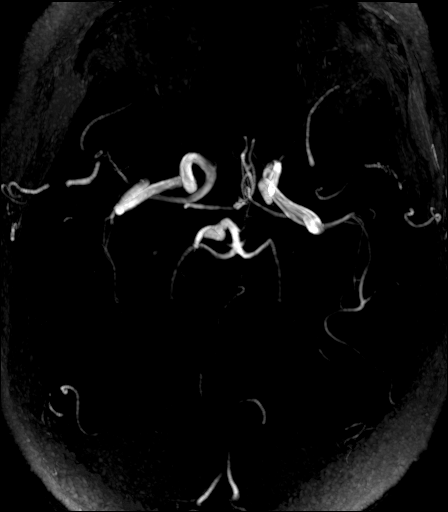

[Series 7: T1 · sagittal · 5.0mm · 0.45mm/px · 1 of 22 slices shown]
[im 1/22]
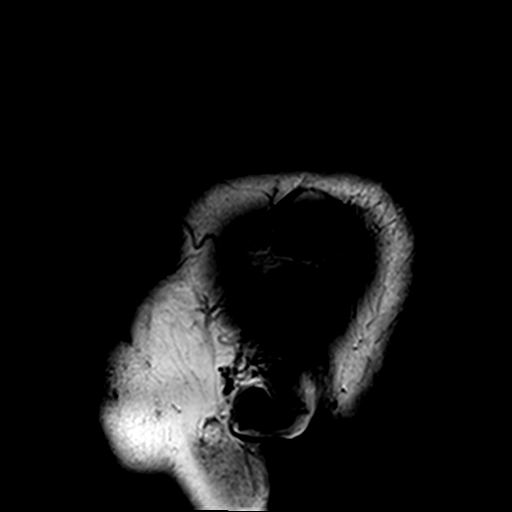

[Series 8: DWI · axial · 3.0mm · 1.80mm/px · z∈[-118,+43]mm · 4 of 110 slices shown (1 of 4)]
[im 1/110]
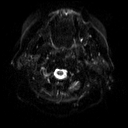
[im 37/110]
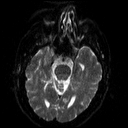
[im 73/110]
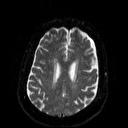
[im 110/110]
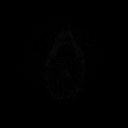

[Series 9: DWI · axial · 3.0mm · 1.80mm/px · z∈[-118,+43]mm · 2 of 55 slices shown (2 of 4)]
[im 1/55]
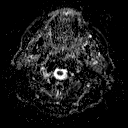
[im 55/55]
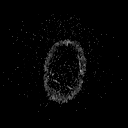

[Series 10: DWI · coronal · 5.0mm · 1.80mm/px · 3 of 72 slices shown (3 of 4)]
[im 1/72]
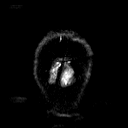
[im 36/72]
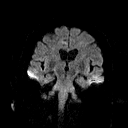
[im 72/72]
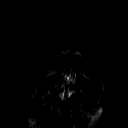

[Series 11: DWI · coronal · 5.0mm · 1.80mm/px · 1 of 36 slices shown (4 of 4)]
[im 1/36]
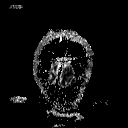

[Series 15: T2 · axial · 5.0mm · 0.51mm/px · 1 of 25 slices shown (1 of 2)]
[im 1/25]
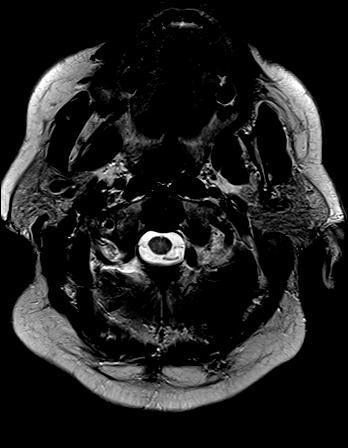

[Series 17: FLAIR · axial · 5.0mm · 0.45mm/px · 1 of 25 slices shown]
[im 1/25]
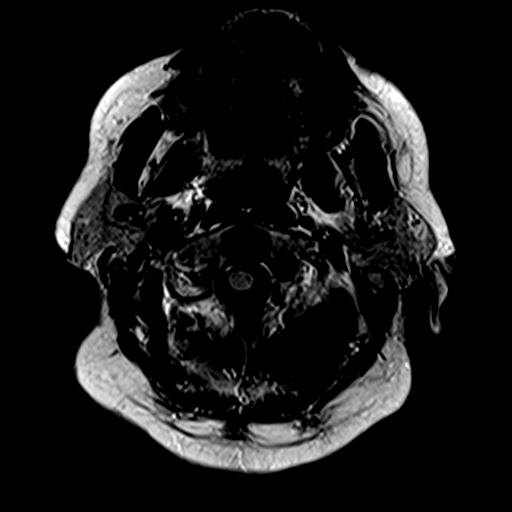

[Series 22: swi_images · axial · 5.0mm · 0.90mm/px · 1 of 30 slices shown]
[im 1/30]
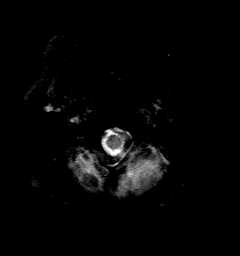

[Series 23: t1_mpr_tra · axial · 5.0mm · 0.71mm/px · 1 of 30 slices shown]
[im 1/30]
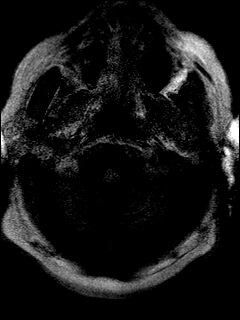

[Series 24: T2 · coronal · 5.0mm · 0.45mm/px · 1 of 36 slices shown (2 of 2)]
[im 1/36]
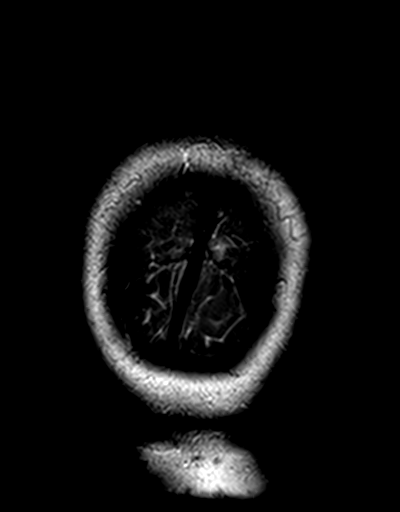

[Series 29: fl_tof_2d · axial · 3.0mm · 0.39mm/px · z∈[-287,-48]mm · 5 of 120 slices shown]
[im 1/120]
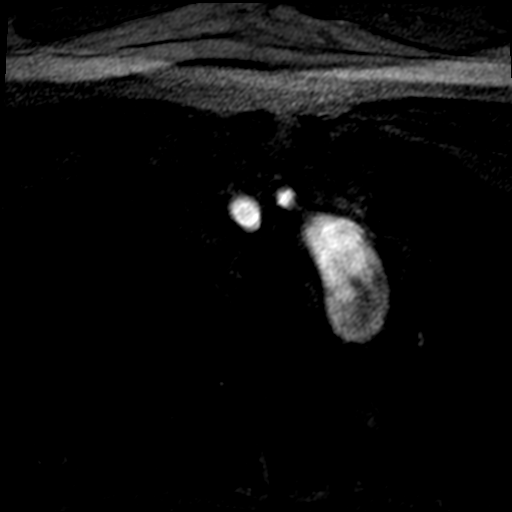
[im 30/120]
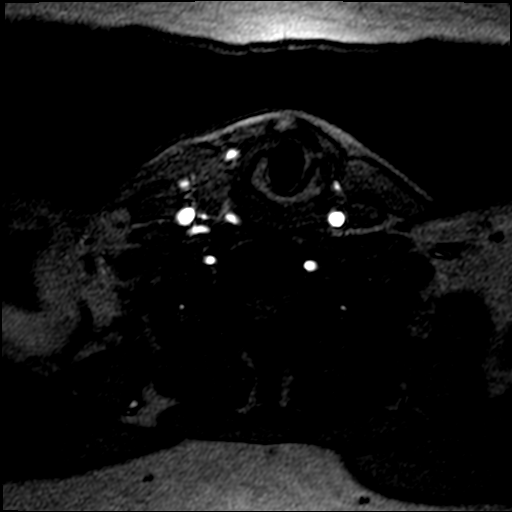
[im 60/120]
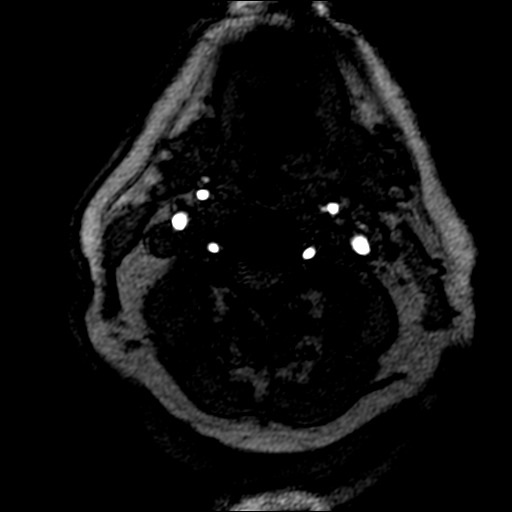
[im 90/120]
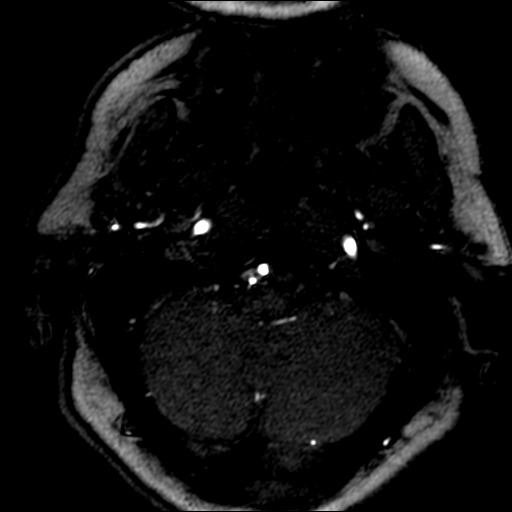
[im 120/120]
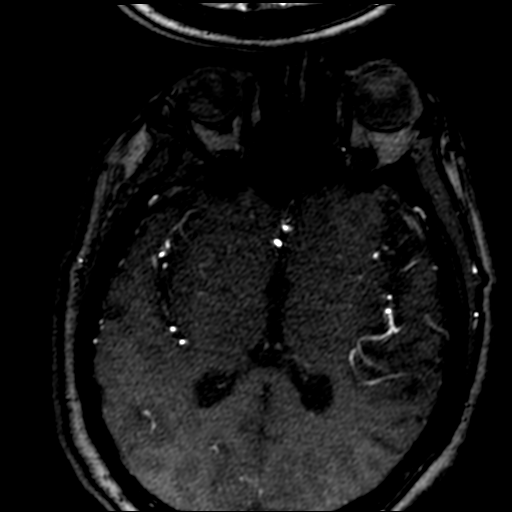

[Series 32: fl_tof_2d_mip_tra · axial · 242.2mm · 0.39mm/px · 1 of 1 slices shown]
[im 1/1]
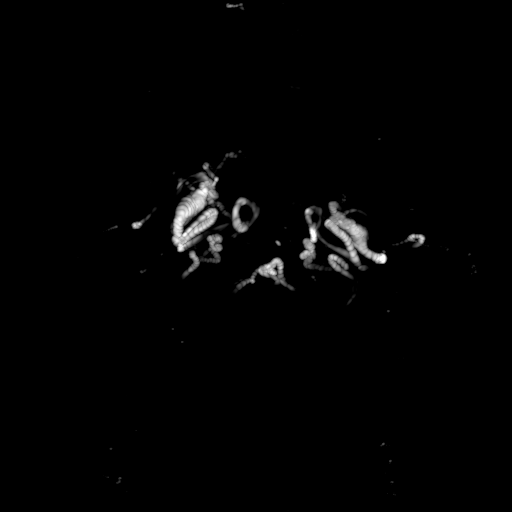

[Series 33: (id)_tt=1.0s · coronal · 0.8mm · 0.78mm/px · 4 of 96 slices shown (1 of 2)]
[im 1/96]
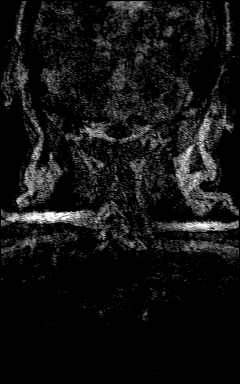
[im 32/96]
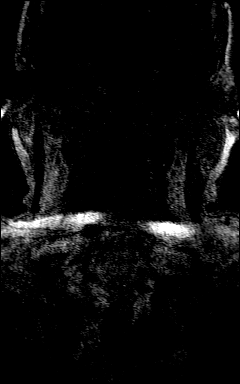
[im 64/96]
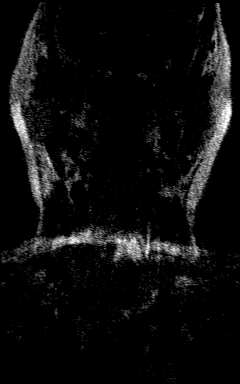
[im 96/96]
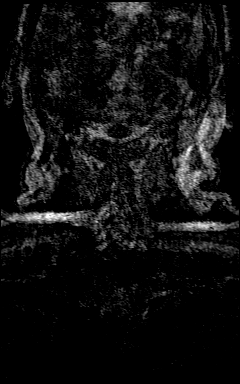

[Series 35: (id)_tt=1.0s · coronal · 0.8mm · 0.78mm/px · 1 of 96 slices shown (2 of 2)]
[im 1/96]
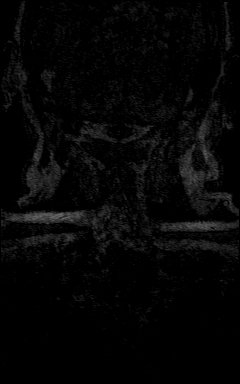

[34 of 48 positions shown; findings below may reference images not displayed]

FINDINGS: MRI HEAD FINDINGS

Brain: Negative for acute infarct. Patchy hyperintensities
throughout the cerebral white matter bilaterally. Brainstem and
cerebellum intact. Negative for hemorrhage or mass. Ventricle size
normal. No midline shift.

Vascular: Normal arterial flow voids

Skull and upper cervical spine: Negative

Sinuses/Orbits: Paranasal sinuses clear. Bilateral cataract removal.

Other: None

MRA HEAD FINDINGS

Both vertebral arteries patent to the basilar without stenosis.
Basilar widely patent. PICA patent bilaterally. AICA, superior
cerebellar, and posterior cerebral arteries normal

Internal carotid artery widely patent bilaterally. Anterior and
middle cerebral arteries normal bilaterally.

Negative for cerebral aneurysm.

MRA NECK FINDINGS

Antegrade flow in the carotid and vertebral arteries bilaterally.

Carotid bifurcation widely patent bilaterally without stenosis. Both
vertebral arteries widely patent without stenosis.
IMPRESSION: No acute infarct. Mild to moderate chronic microvascular ischemic
change in the white matter

Negative MRA head

Negative MRA neck
